# Patient Record
Sex: Male | Born: 1995 | Hispanic: No | Marital: Single | Smoking: Current every day smoker
Health system: Southern US, Community
[De-identification: ages and names within clinical notes are randomized; demographics above are authoritative.]

## PROBLEM LIST (undated history)

## (undated) HISTORY — PX: HERNIA REPAIR: SHX51

---

## 2018-03-20 ENCOUNTER — Encounter (HOSPITAL_BASED_OUTPATIENT_CLINIC_OR_DEPARTMENT_OTHER): Payer: Self-pay | Admitting: *Deleted

## 2018-03-20 ENCOUNTER — Other Ambulatory Visit: Payer: Self-pay

## 2018-03-20 ENCOUNTER — Emergency Department (HOSPITAL_BASED_OUTPATIENT_CLINIC_OR_DEPARTMENT_OTHER): Payer: 59

## 2018-03-20 ENCOUNTER — Emergency Department (HOSPITAL_BASED_OUTPATIENT_CLINIC_OR_DEPARTMENT_OTHER)
Admission: EM | Admit: 2018-03-20 | Discharge: 2018-03-20 | Disposition: A | Discharge status: Home / Self Care | Payer: 59 | Attending: Emergency Medicine | Admitting: Emergency Medicine

## 2018-03-20 DIAGNOSIS — N50811 Right testicular pain: Secondary | ICD-10-CM | POA: Diagnosis present

## 2018-03-20 DIAGNOSIS — N50819 Testicular pain, unspecified: Secondary | ICD-10-CM

## 2018-03-20 LAB — URINALYSIS, ROUTINE W REFLEX MICROSCOPIC
BILIRUBIN URINE: NEGATIVE
Glucose, UA: NEGATIVE mg/dL
Ketones, ur: NEGATIVE mg/dL
Leukocytes, UA: NEGATIVE
Nitrite: NEGATIVE
Protein, ur: NEGATIVE mg/dL
SPECIFIC GRAVITY, URINE: 1.01 (ref 1.005–1.030)
pH: 5.5 (ref 5.0–8.0)

## 2018-03-20 LAB — URINALYSIS, MICROSCOPIC (REFLEX)

## 2018-03-20 NOTE — ED Triage Notes (Signed)
Pt reports right testicle sore x 3 days. Denies injury, denies penile discharge. States in a relationship, no new sexual contacts

## 2018-03-20 NOTE — ED Provider Notes (Signed)
[facility] EMERGENCY DEPARTMENT Provider Note   CSN: [id number] Arrival date & time: 03/20/18  1513     History   Chief Complaint Chief Complaint  Patient presents with  . Testicle Pain    HPI Aaron Powell is a 22 y.o. male.  Pt presents to the ED today with right testicular pain.  Pt said it has been going on for 3 days.  Pt denies any known STD exposure.  He denies penile d/c.  No other sx.     History reviewed. No pertinent past medical history.  There are no active problems to display for this patient.   Past Surgical History:  Procedure Laterality Date  . HERNIA REPAIR          Home Medications    Prior to Admission medications   Not on File    Family History No family history on file.  Social History Social History   Tobacco Use  . Smoking status: Never Smoker  . Smokeless tobacco: Never Used  Substance Use Topics  . Alcohol use: Yes  . Drug use: Never     Allergies   Patient has no known allergies.   Review of Systems Review of Systems  Genitourinary: Positive for testicular pain.  All other systems reviewed and are negative.    Physical Exam Updated Vital Signs BP 127/69 (BP Location: Left Arm)   Pulse (!) 50   Temp 97.7 F (36.5 C) (Oral)   Resp 18   Ht 5\' 8"  (1.727 m)   Wt 74.8 kg   SpO2 99%   BMI 25.09 kg/m   Physical Exam Vitals signs and nursing note reviewed.  Constitutional:      Appearance: Normal appearance. He is normal weight.  HENT:     Head: Normocephalic.     Comments: Healing bruise under left eye.  No pain.    Nose: Nose normal.     Mouth/Throat:     Pharynx: Oropharynx is clear.  Eyes:     Pupils: Pupils are equal, round, and reactive to light.  Neck:     Musculoskeletal: Normal range of motion and neck supple.  Cardiovascular:     Rate and Rhythm: Normal rate and regular rhythm.  Pulmonary:     Effort: Pulmonary effort is normal.     Breath sounds: Normal breath sounds.    Abdominal:     General: Abdomen is flat.  Genitourinary:    Penis: Circumcised.      Scrotum/Testes:        Right: Tenderness present. Swelling not present.        Left: Swelling not present.     Epididymis:     Right: Tenderness present.  Musculoskeletal: Normal range of motion.  Skin:    General: Skin is warm.     Capillary Refill: Capillary refill takes less than 2 seconds.  Neurological:     General: No focal deficit present.     Mental Status: He is alert.  Psychiatric:        Mood and Affect: Mood normal.      ED Treatments / Results  Labs (all labs ordered are listed, but only abnormal results are displayed) Labs Reviewed  URINALYSIS, ROUTINE W REFLEX MICROSCOPIC - Abnormal; Notable for the following components:      Result Value   Hgb urine dipstick TRACE (*)    All other components within normal limits  URINALYSIS, MICROSCOPIC (REFLEX) - Abnormal; Notable for the following components:   Bacteria, UA  FEW (*)    All other components within normal limits  GC/CHLAMYDIA PROBE AMP [facility]    EKG None  Radiology [place] Scrotum W/doppler  Result Date: 03/20/2018 CLINICAL DATA:  Enhance linear LEFT testicular pain for 3 days. EXAM: SCROTAL ULTRASOUND DOPPLER ULTRASOUND OF THE TESTICLES TECHNIQUE: Complete ultrasound examination of the testicles, epididymis, and other scrotal structures was performed. Color and spectral Doppler ultrasound were also utilized to evaluate blood flow to the testicles. COMPARISON:  None. FINDINGS: Right testicle Measurements: 4.8 x 2.2 x 2.7 cm.  Homogeneous echotexture.  No mass Left testicle Measurements: 4.7 x 2.7 x 3.2 cm. Homogeneous echotexture. No mass. A solitary small calcification is considered benign. Right epididymis:  Normal in size and appearance. Left epididymis:  Normal in size and appearance. Hydrocele:  None visualized. Varicocele:  None visualized. Pulsed Doppler interrogation of both testes demonstrates normal  low resistance arterial and venous waveforms bilaterally. IMPRESSION: Normal testicular ultrasound. Electronically Signed   By: Genevive BiStewart  Edmunds M.D.   On: 03/20/2018 17:50    Procedures Procedures (including critical care time)  Medications Ordered in ED Medications - No data to display   Initial Impression / Assessment and Plan / ED Course  I have reviewed the triage vital signs and the nursing notes.  Pertinent labs & imaging results that were available during my care of the patient were reviewed by me and considered in my medical decision making (see chart for details).     Testicular us nl.  UA nl.  Gc/Chl pending.  Pt is told to wear supportive briefs.  F/u with urology if sx worsen.  Return if worse.  Final Clinical Impressions(s) / ED Diagnoses   Final diagnoses:  Testicular pain    ED Discharge Orders    None       Jacalyn LefevreHaviland, Christana Angelica, MD 03/20/18 1810

## 2018-03-20 NOTE — Discharge Instructions (Signed)
Tylenol and ibuprofen for pain

## 2018-03-22 LAB — GC/CHLAMYDIA PROBE AMP (~~LOC~~) NOT AT ARMC
Chlamydia: NEGATIVE
Neisseria Gonorrhea: NEGATIVE

## 2019-03-14 IMAGING — US US SCROTUM W/ DOPPLER COMPLETE
1 series · 14 of 25 positions shown · non-contrast
Comparison: None.

CLINICAL DATA: Enhance linear LEFT testicular pain for 3 days.

EXAM:
SCROTAL ULTRASOUND
DOPPLER ULTRASOUND OF THE TESTICLES
TECHNIQUE: Complete ultrasound examination of the testicles, epididymis, and
other scrotal structures was performed. Color and spectral Doppler
ultrasound were also utilized to evaluate blood flow to the
testicles.

[Series 1: us scrotum w/ doppler complete · 0.07mm/px · 14 of 37 slices shown]
[im 1/37]
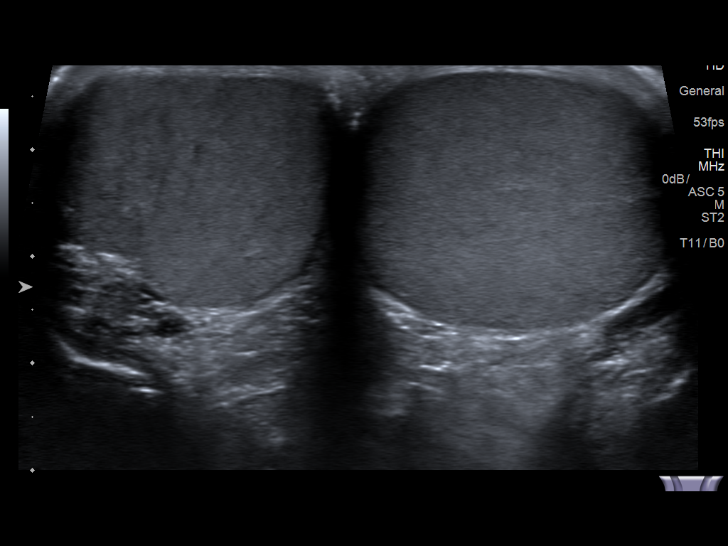
[im 4/37]
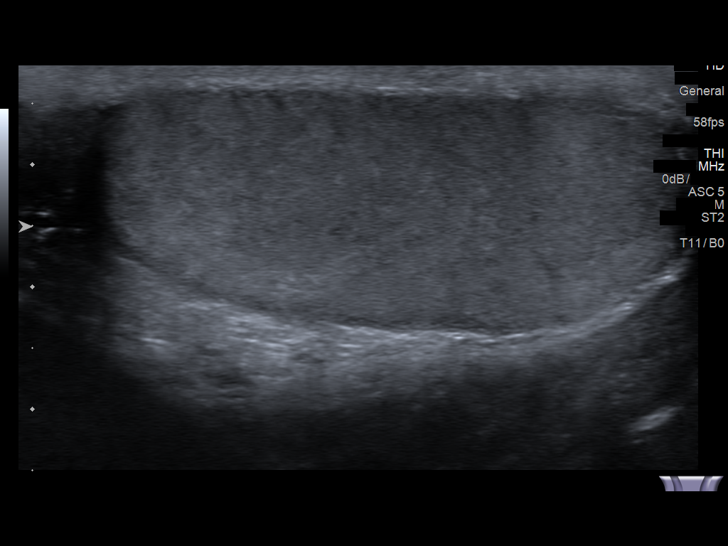
[im 7/37]
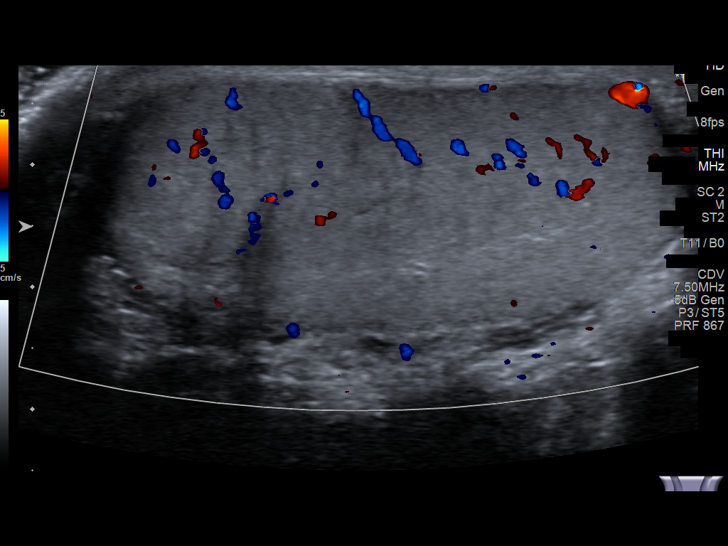
[im 10/37]
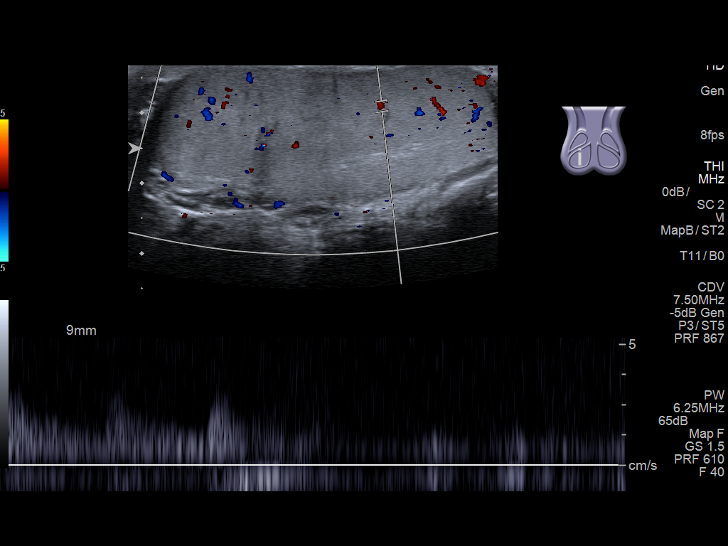
[im 13/37]
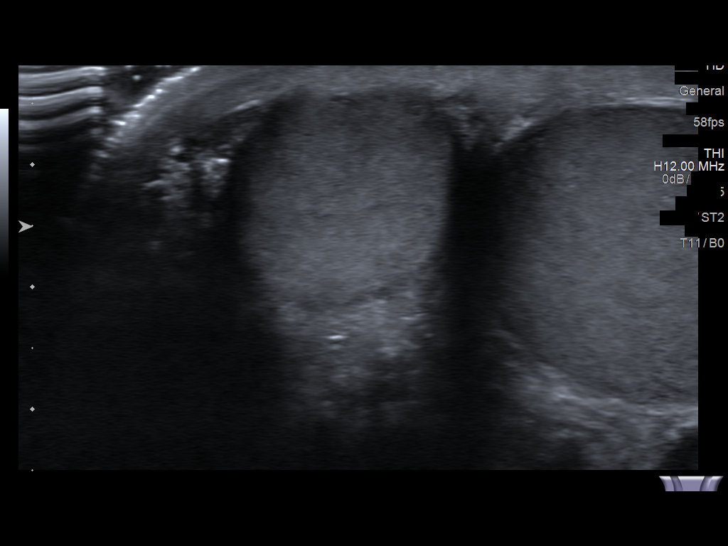
[im 14/37]
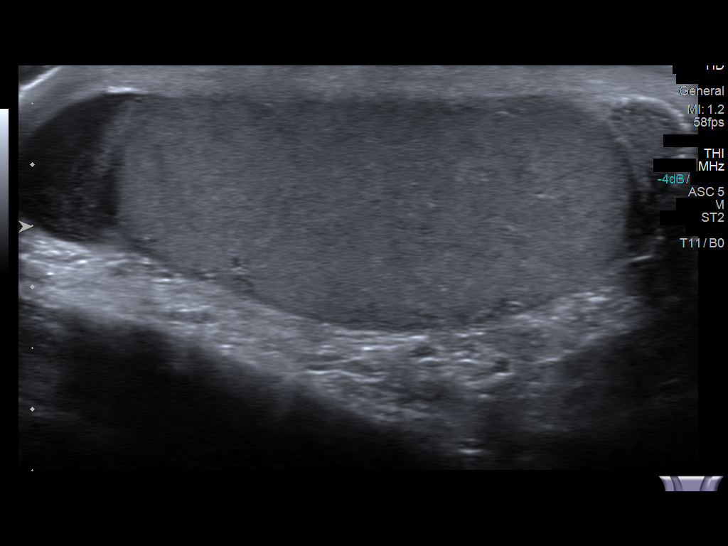
[im 17/37]
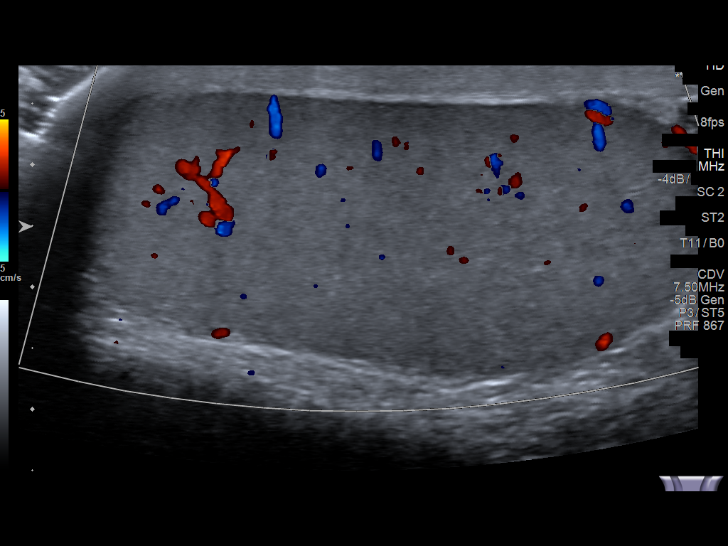
[im 20/37]
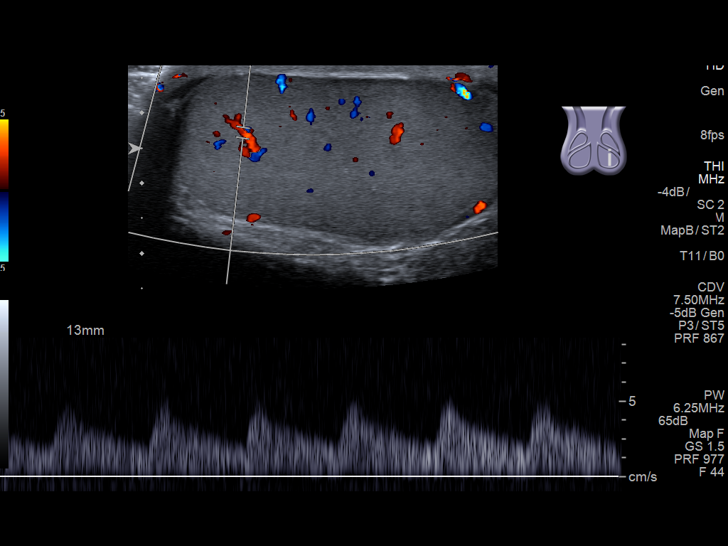
[im 23/37]
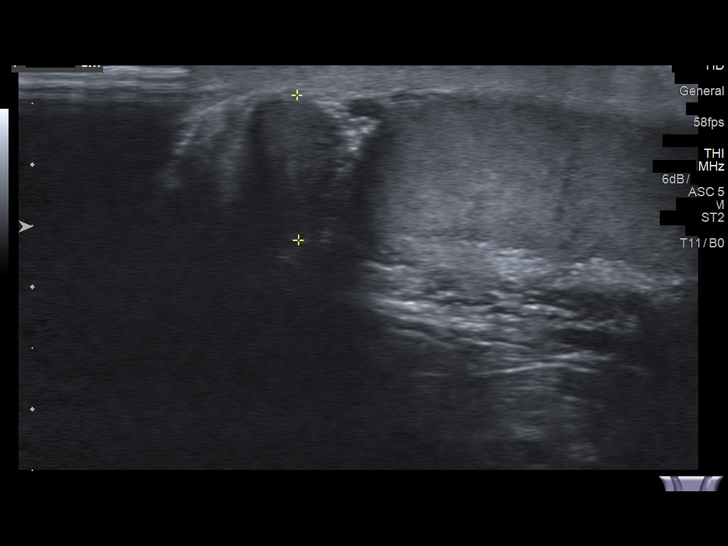
[im 25/37]
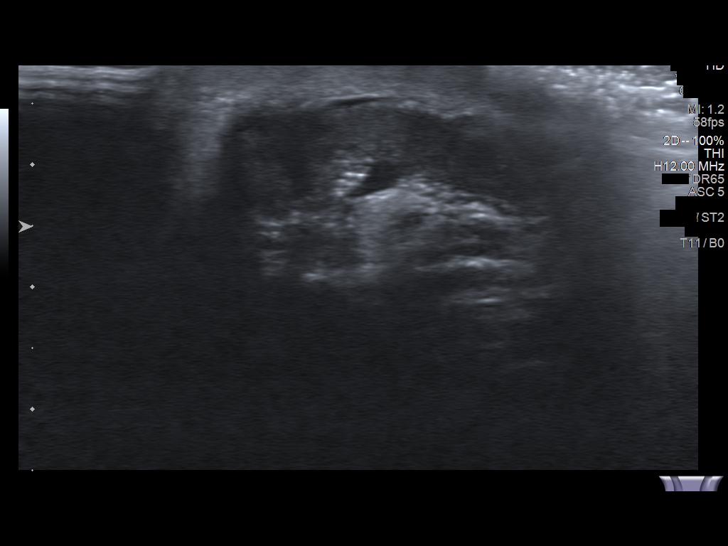
[im 28/37]
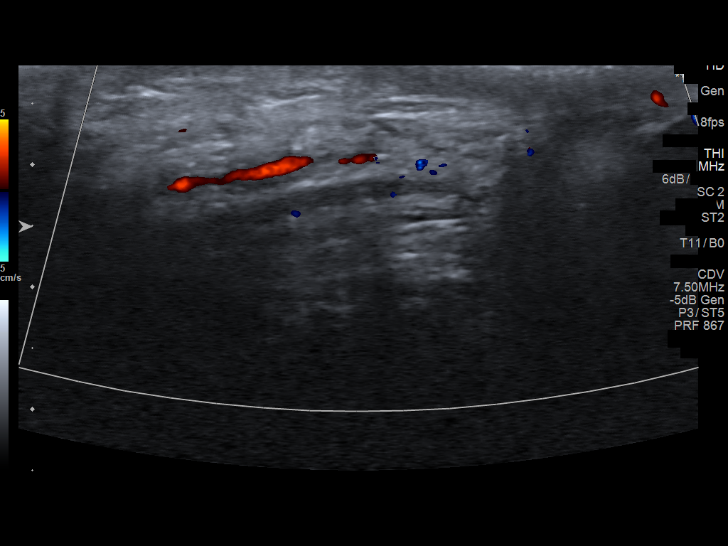
[im 31/37]
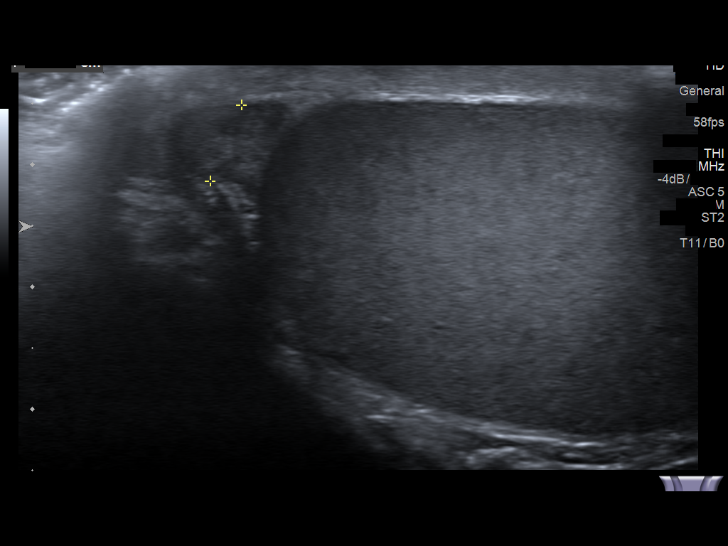
[im 34/37]
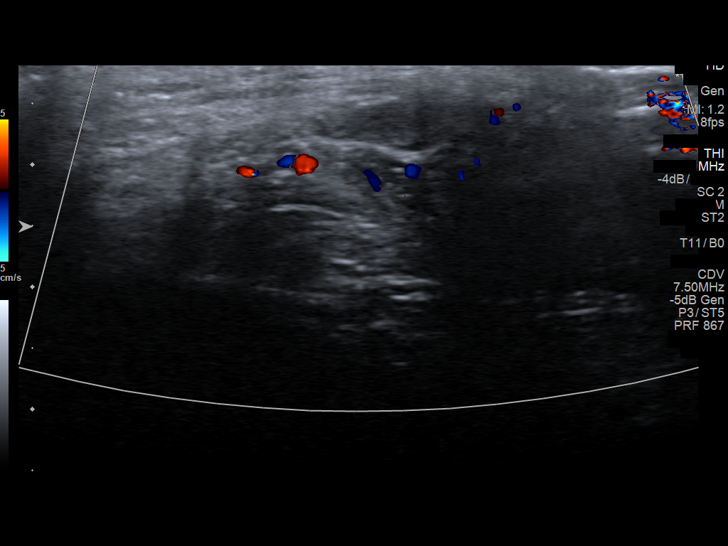
[im 37/37]
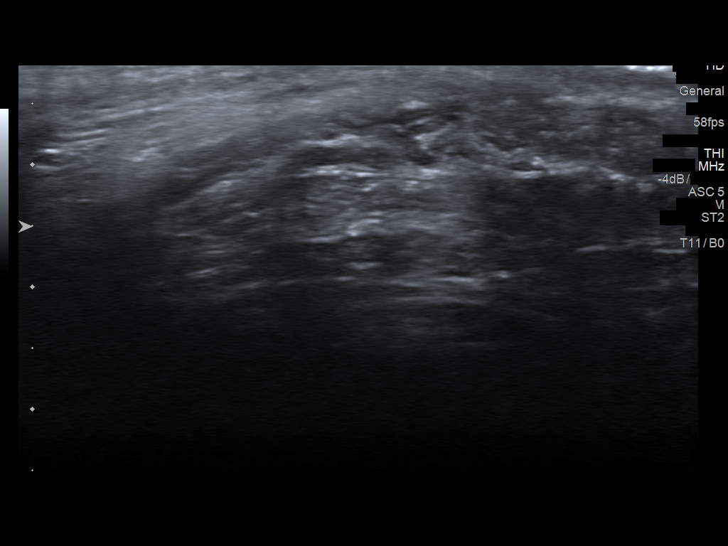

[14 of 25 positions shown; findings below may reference images not displayed]

FINDINGS: Right testicle

Measurements: 4.8 x 2.2 x 2.7 cm.  Homogeneous echotexture.  No mass

Left testicle

Measurements: 4.7 x 2.7 x 3.2 cm. Homogeneous echotexture. No mass.
A solitary small calcification is considered benign.

Right epididymis:  Normal in size and appearance.

Left epididymis:  Normal in size and appearance.

Hydrocele:  None visualized.

Varicocele:  None visualized.

Pulsed Doppler interrogation of both testes demonstrates normal low
resistance arterial and venous waveforms bilaterally.
IMPRESSION: Normal testicular ultrasound.

## 2019-03-17 ENCOUNTER — Ambulatory Visit: Payer: 59 | Attending: Internal Medicine

## 2019-03-17 DIAGNOSIS — Z20822 Contact with and (suspected) exposure to covid-19: Secondary | ICD-10-CM

## 2019-03-18 LAB — NOVEL CORONAVIRUS, NAA: SARS-CoV-2, NAA: DETECTED — AB

## 2019-05-26 ENCOUNTER — Emergency Department (HOSPITAL_BASED_OUTPATIENT_CLINIC_OR_DEPARTMENT_OTHER)
Admission: EM | Admit: 2019-05-26 | Discharge: 2019-05-26 | Disposition: A | Discharge status: Home / Self Care | Payer: 59 | Attending: Emergency Medicine | Admitting: Emergency Medicine

## 2019-05-26 ENCOUNTER — Emergency Department (HOSPITAL_BASED_OUTPATIENT_CLINIC_OR_DEPARTMENT_OTHER): Payer: 59

## 2019-05-26 ENCOUNTER — Encounter (HOSPITAL_BASED_OUTPATIENT_CLINIC_OR_DEPARTMENT_OTHER): Payer: Self-pay | Admitting: Emergency Medicine

## 2019-05-26 ENCOUNTER — Other Ambulatory Visit: Payer: Self-pay

## 2019-05-26 DIAGNOSIS — F17290 Nicotine dependence, other tobacco product, uncomplicated: Secondary | ICD-10-CM | POA: Insufficient documentation

## 2019-05-26 DIAGNOSIS — R0789 Other chest pain: Secondary | ICD-10-CM | POA: Diagnosis present

## 2019-05-26 DIAGNOSIS — K2920 Alcoholic gastritis without bleeding: Secondary | ICD-10-CM | POA: Diagnosis not present

## 2019-05-26 LAB — CBC WITH DIFFERENTIAL/PLATELET
Abs Immature Granulocytes: 0.01 10*3/uL (ref 0.00–0.07)
Basophils Absolute: 0 10*3/uL (ref 0.0–0.1)
Basophils Relative: 1 %
Eosinophils Absolute: 0 10*3/uL (ref 0.0–0.5)
Eosinophils Relative: 1 %
HCT: 45.6 % (ref 39.0–52.0)
Hemoglobin: 15.4 g/dL (ref 13.0–17.0)
Immature Granulocytes: 0 %
Lymphocytes Relative: 49 %
Lymphs Abs: 3.3 10*3/uL (ref 0.7–4.0)
MCH: 30.8 pg (ref 26.0–34.0)
MCHC: 33.8 g/dL (ref 30.0–36.0)
MCV: 91.2 fL (ref 80.0–100.0)
Monocytes Absolute: 0.4 10*3/uL (ref 0.1–1.0)
Monocytes Relative: 6 %
Neutro Abs: 2.8 10*3/uL (ref 1.7–7.7)
Neutrophils Relative %: 43 %
Platelets: 271 10*3/uL (ref 150–400)
RBC: 5 MIL/uL (ref 4.22–5.81)
RDW: 12.5 % (ref 11.5–15.5)
WBC: 6.6 10*3/uL (ref 4.0–10.5)
nRBC: 0 % (ref 0.0–0.2)

## 2019-05-26 LAB — BASIC METABOLIC PANEL
Anion gap: 9 (ref 5–15)
BUN: 16 mg/dL (ref 6–20)
CO2: 27 mmol/L (ref 22–32)
Calcium: 10.2 mg/dL (ref 8.9–10.3)
Chloride: 102 mmol/L (ref 98–111)
Creatinine, Ser: 0.89 mg/dL (ref 0.61–1.24)
GFR calc Af Amer: >60 mL/min (ref >60)
GFR calc non Af Amer: >60 mL/min (ref >60)
Glucose, Bld: 76 mg/dL (ref 70–99)
Potassium: 3.9 mmol/L (ref 3.5–5.1)
Sodium: 138 mmol/L (ref 135–145)

## 2019-05-26 LAB — TROPONIN I (HIGH SENSITIVITY): Troponin I (High Sensitivity): <2 ng/L (ref <18)

## 2019-05-26 LAB — HEPATIC FUNCTION PANEL
ALT: 38 U/L (ref 0–44)
AST: 37 U/L (ref 15–41)
Albumin: 5.3 g/dL — ABNORMAL HIGH (ref 3.5–5.0)
Alkaline Phosphatase: 68 U/L (ref 38–126)
Bilirubin, Direct: <0.1 mg/dL (ref 0.0–0.2)
Total Bilirubin: 0.5 mg/dL (ref 0.3–1.2)
Total Protein: 8.6 g/dL — ABNORMAL HIGH (ref 6.5–8.1)

## 2019-05-26 LAB — LIPASE, BLOOD: Lipase: 36 U/L (ref 11–51)

## 2019-05-26 MED ORDER — LIDOCAINE VISCOUS HCL 2 % MT SOLN
15.0000 mL | Freq: Once | OROMUCOSAL | Status: AC
  Administered 2019-05-26: 20:00:00 15 mL via ORAL
  Filled 2019-05-26: qty 15

## 2019-05-26 MED ORDER — PANTOPRAZOLE SODIUM 20 MG PO TBEC: 20.0000 mg | DELAYED_RELEASE_TABLET | Freq: Every day | ORAL | 0 refills | Status: AC

## 2019-05-26 MED ORDER — ALUM & MAG HYDROXIDE-SIMETH 200-200-20 MG/5ML PO SUSP
30.0000 mL | Freq: Once | ORAL | Status: AC
  Administered 2019-05-26: 20:00:00 30 mL via ORAL
  Filled 2019-05-26: qty 30

## 2019-05-26 MED ORDER — SODIUM CHLORIDE 0.9 % IV BOLUS
500.0000 mL | Freq: Once | INTRAVENOUS | Status: AC
  Administered 2019-05-26: 500 mL via INTRAVENOUS

## 2019-05-26 MED ORDER — PANTOPRAZOLE SODIUM 40 MG IV SOLR
40.0000 mg | Freq: Once | INTRAVENOUS | Status: AC
  Administered 2019-05-26: 20:00:00 40 mg via INTRAVENOUS
  Filled 2019-05-26: qty 40

## 2019-05-26 MED ORDER — FAMOTIDINE 20 MG PO TABS: 20.0000 mg | ORAL_TABLET | Freq: Two times a day (BID) | ORAL | 0 refills | Status: DC

## 2019-05-26 NOTE — ED Triage Notes (Signed)
Pain under L ribs x 3 days with SOB

## 2019-05-26 NOTE — Discharge Instructions (Signed)
You have been diagnosed today with atypical chest pain, stomach inflammation.  At this time there does not appear to be the presence of an emergent medical condition, however there is always the potential for conditions to change. Please read and follow the below instructions.  Please return to the Emergency Department immediately for any new or worsening symptoms. Please be sure to follow up with your Primary Care Provider within one week regarding your visit today; please call their office to schedule an appointment even if you are feeling better for a follow-up visit. Please take the medications Pepcid and Protonix as prescribed to help with your symptoms.  Please avoid alcohol use as this will likely worsen your symptoms.  Please drink plenty water and get plenty of rest.  Please call the gastroenterologist Dr. Loreta Ave on your discharge paperwork tomorrow morning to schedule a follow-up appointment for further evaluation.  Get help right away if: You throw up blood or something that looks like coffee grounds. You have black or dark red poop. You throw up any time you try to drink fluids. Your stomach pain gets worse. You have a fever. You do not feel better after one week. Your chest pain is worse. You have a cough that gets worse, or you cough up blood. You have very bad (severe) pain in your belly (abdomen). You pass out (faint). You have either of these for no clear reason: Sudden chest discomfort. Sudden discomfort in your arms, back, neck, or jaw. You have shortness of breath at any time. You suddenly start to sweat, or your skin gets clammy. You feel sick to your stomach (nauseous). You throw up (vomit). You suddenly feel lightheaded or dizzy. You feel very weak or tired. Your heart starts to beat fast, or it feels like it is skipping beats. You have any new/concerning or worsening of symptoms   Please read the additional information packets attached to your discharge  summary.  Do not take your medicine if  develop an itchy rash, swelling in your mouth or lips, or difficulty breathing; call 911 and seek immediate emergency medical attention if this occurs.  Note: Portions of this text may have been transcribed using voice recognition software. Every effort was made to ensure accuracy; however, inadvertent computerized transcription errors may still be present.

## 2019-05-26 NOTE — ED Provider Notes (Signed)
[facility] EMERGENCY DEPARTMENT Provider Note   CSN: [id number] Arrival date & time: 05/26/19  1831     History Chief Complaint  Patient presents with  . Chest Pain    Aaron Powell is a 23 y.o. male otherwise healthy no daily medication use.  Patient presents today for left upper quadrant pain that began Monday morning.  Patient reports that he was out with his friends on Sunday night and had drank some alcohol, he reports that he does not often drink alcohol and this was probably the first time in 6 months that he had drank.  When he woke up on Monday morning he had a left upper quadrant abdominal pain a burning sensation nonradiating worsened with eating and without alleviating factors, no medications prior to arrival for his symptoms, pain is currently mild-moderate in intensity.  He reports that when pain is at its worst he feels like it causes him to be slightly short of breath but he denies any shortness of breath today.  Additionally patient reports one episode of dark stool yesterday, he denies that stool is black but reports darker than normal.  Denies fever/chills, headache, neck pain, cough/hemoptysis, nausea/vomiting, diarrhea, dysuria/hematuria, fall/injury, extremity swelling/color change, exogenous hormone use, immobilization, surgeries, history of cancer, history of blood clot or any additional concerns.  HPI     History reviewed. No pertinent past medical history.  There are no problems to display for this patient.   Past Surgical History:  Procedure Laterality Date  . HERNIA REPAIR         No family history on file.  Social History   Tobacco Use  . Smoking status: Current Every Day Smoker    Types: E-cigarettes  . Smokeless tobacco: Never Used  Substance Use Topics  . Alcohol use: Yes  . Drug use: Never    Home Medications Prior to Admission medications   Medication Sig Start Date End Date Taking? Authorizing Provider  famotidine  (PEPCID) 20 MG tablet Take 1 tablet (20 mg total) by mouth 2 (two) times daily. 05/26/19   Harlene Salts A, PA-C  pantoprazole (PROTONIX) 20 MG tablet Take 1 tablet (20 mg total) by mouth daily. 05/26/19   Bill Salinas, PA-C    Allergies    Patient has no known allergies.  Review of Systems   Review of Systems Ten systems are reviewed and are negative for acute change except as noted in the HPI  Physical Exam Updated Vital Signs BP (!) 110/39 (BP Location: Right Arm)   Pulse (!) 54   Temp 98.3 F (36.8 C) (Oral)   Resp 18   Ht 5\' 8"  (1.727 m)   Wt 72.6 kg   SpO2 99%   BMI 24.33 kg/m   Physical Exam Constitutional:      General: He is not in acute distress.    Appearance: Normal appearance. He is well-developed. He is not ill-appearing or diaphoretic.  HENT:     Head: Normocephalic and atraumatic.     Right Ear: External ear normal.     Left Ear: External ear normal.     Nose: Nose normal.  Eyes:     General: Vision grossly intact. Gaze aligned appropriately.     Pupils: Pupils are equal, round, and reactive to light.  Neck:     Trachea: Trachea and phonation normal. No tracheal deviation.  Cardiovascular:     Rate and Rhythm: Normal rate and regular rhythm.     Pulses:  Dorsalis pedis pulses are 2+ on the right side and 2+ on the left side.     Heart sounds: Normal heart sounds.  Pulmonary:     Effort: Pulmonary effort is normal. No respiratory distress.     Breath sounds: Normal breath sounds.  Chest:     Chest wall: No deformity, tenderness or crepitus.  Abdominal:     General: There is no distension.     Palpations: Abdomen is soft.     Tenderness: There is abdominal tenderness in the left upper quadrant. There is no guarding or rebound. Negative signs include Murphy's sign.  Musculoskeletal:        General: Normal range of motion.     Cervical back: Normal range of motion.     Right lower leg: No tenderness. No edema.     Left lower leg: No  tenderness. No edema.  Skin:    General: Skin is warm and dry.  Neurological:     Mental Status: He is alert.     GCS: GCS eye subscore is 4. GCS verbal subscore is 5. GCS motor subscore is 6.     Comments: Speech is clear and goal oriented, follows commands Major Cranial nerves without deficit, no facial droop Moves extremities without ataxia, coordination intact  Psychiatric:        Behavior: Behavior normal.     ED Results / Procedures / Treatments   Labs (all labs ordered are listed, but only abnormal results are displayed) Labs Reviewed  HEPATIC FUNCTION PANEL - Abnormal; Notable for the following components:      Result Value   Total Protein 8.6 (*)    Albumin 5.3 (*)    All other components within normal limits  BASIC METABOLIC PANEL  LIPASE, BLOOD  CBC WITH DIFFERENTIAL/PLATELET  TROPONIN I (HIGH SENSITIVITY)    EKG EKG Interpretation  Date/Time:  Thursday May 26 2019 18:44:59 EST Ventricular Rate:  51 PR Interval:    QRS Duration: 90 QT Interval:  420 QTC Calculation: 387 R Axis:   97 Text Interpretation: Sinus rhythm Borderline right axis deviation no acute ST/T changes No old tracing to compare Confirmed by Pricilla Loveless [id number]) on 05/26/2019 7:07:31 PM   Radiology DG Chest Port 1 View  Result Date: 05/26/2019 CLINICAL DATA:  Left rib pain for 3 days, short of breath EXAM: PORTABLE CHEST 1 VIEW COMPARISON:  None. FINDINGS: The heart size and mediastinal contours are within normal limits. Both lungs are clear. The visualized skeletal structures are unremarkable. IMPRESSION: No active disease. Electronically Signed   By: Sharlet Salina M.D.   On: 05/26/2019 20:07    Procedures Procedures (including critical care time)  Medications Ordered in ED Medications  alum & mag hydroxide-simeth (MAALOX/MYLANTA) 200-200-20 MG/5ML suspension 30 mL (30 mLs Oral Given 05/26/19 1955)    And  lidocaine (XYLOCAINE) 2 % viscous mouth solution 15 mL (15 mLs Oral  Given 05/26/19 1955)  pantoprazole (PROTONIX) injection 40 mg (40 mg Intravenous Given 05/26/19 1951)  sodium chloride 0.9 % bolus 500 mL (500 mLs Intravenous New Bag/Given 05/26/19 1949)    ED Course  I have reviewed the triage vital signs and the nursing notes.  Pertinent labs & imaging results that were available during my care of the patient were reviewed by me and considered in my medical decision making (see chart for details).    MDM Rules/Calculators/A&P  24 year old male presents today with left upper quadrant pain/left lower chest pain x3 days, began after he woke up Monday morning, he had been drinking alcohol Sunday night with his friends, he reports he does not drink very often.  He has not tried any medication for symptoms.  No recent infectious-like symptoms or injuries.  On initial evaluation he is well-appearing in no acute distress vital signs stable on room air.  Heart regular rate and rhythm without murmur, lungs clear to auscultation, abdomen soft with minimal left upper quadrant tenderness, no guarding or peritoneal signs, negative Murphy sign.  He is neurovascular tact to all 4 extremities without evidence of DVT.  He is low risk by Wells criteria and PERC negative.  High suspicion for gastritis based on history and physical examination today.  Chest pain work-up has been started, will add lipase and LFTs today.  Will give GI cocktail and Protonix.  Additionally patient reported dark stool earlier this week.  He refuses rectal examination for Hemoccult test.  He denies any black stool or hematochezia. - High-sensitivity troponin negative, in the setting of ongoing pain for 3 days no delta troponin is indicated Lipase within normal limits no evidence of pancreatitis BMP within normal limits no emergent electrolyte derangement or evidence of kidney injury CBC within normal limits no sign of anemia or leukocytosis to suggest infection LFTs with mildly elevated  protein and albumin, LFTs within normal limits Chest x-ray:  IMPRESSION:  No active disease.   I personally reviewed patient's chest x-ray and agree with radiologist interpretation.  EKG: Sinus rhythm Borderline right axis deviation no acute ST/T changes No old tracing to compare Confirmed by Sherwood Gambler [id number]) on 05/26/2019 7:07:31 PM - Based on history, examination and work-up today there is no evidence for ACS as cause of his symptoms today, no indication for delta troponin, low risk by Wells criteria and PERC negative doubt pulmonary embolism as etiology of his symptoms.  Additionally history and work-up is inconsistent with a dissection or other acute cardiopulmonary etiology of his symptoms today.  Abdominal examination is benign and abdominal labs are reassuring today.  No evidence for a perforation, SBO, cholecystitis, appendicitis or other emergent intra-abdominal pathologies requiring imaging or further work-up at this time.  Suspect patient is experiencing gastritis due to alcohol intake on Sunday night.  Plan to treat with Pepcid/Protonix and refer to GI.  Discussed case and plan with Dr. Regenia Skeeter who agrees. - 9:05 PM: Patient reevaluated resting comfortably no acute distress vital signs stable.  He reports improvement of his symptoms following GI cocktail and Protonix today.  He refuses rectal examination for evaluation of occult blood in the stool and is requesting discharge.  Feel this is reasonable at this time, will be treating patient with Pepcid/Protonix and refer to GI, no signs of anemia today and there is no indication for emergent GI consultation or any further work-up at this time.  Discussed with Dr. Regenia Skeeter who agrees.  At this time there does not appear to be any evidence of an acute emergency medical condition and the patient appears stable for discharge with appropriate outpatient follow up. Diagnosis was discussed with patient who verbalizes understanding of care plan  and is agreeable to discharge. I have discussed return precautions with patient who verbalizes understanding of return precautions. Patient encouraged to follow-up with their PCP and GI. All questions answered.   Note: Portions of this report may have been transcribed using voice recognition software. Every effort was made to ensure  accuracy; however, inadvertent computerized transcription errors may still be present. Final Clinical Impression(s) / ED Diagnoses Final diagnoses:  Atypical chest pain  Acute alcoholic gastritis, presence of bleeding unspecified    Rx / DC Orders ED Discharge Orders         Ordered    famotidine (PEPCID) 20 MG tablet  2 times daily     05/26/19 2122    pantoprazole (PROTONIX) 20 MG tablet  Daily     05/26/19 2122           Elizabeth Palau 05/26/19 2123    Pricilla Loveless, MD 05/27/19 [phone]

## 2020-11-16 IMAGING — DX DG CHEST 1V PORT
1 series · 1 of 1 positions shown · non-contrast
Comparison: None.

CLINICAL DATA: Left rib pain for 3 days, short of breath

EXAM:
PORTABLE CHEST 1 VIEW

[chest ap]
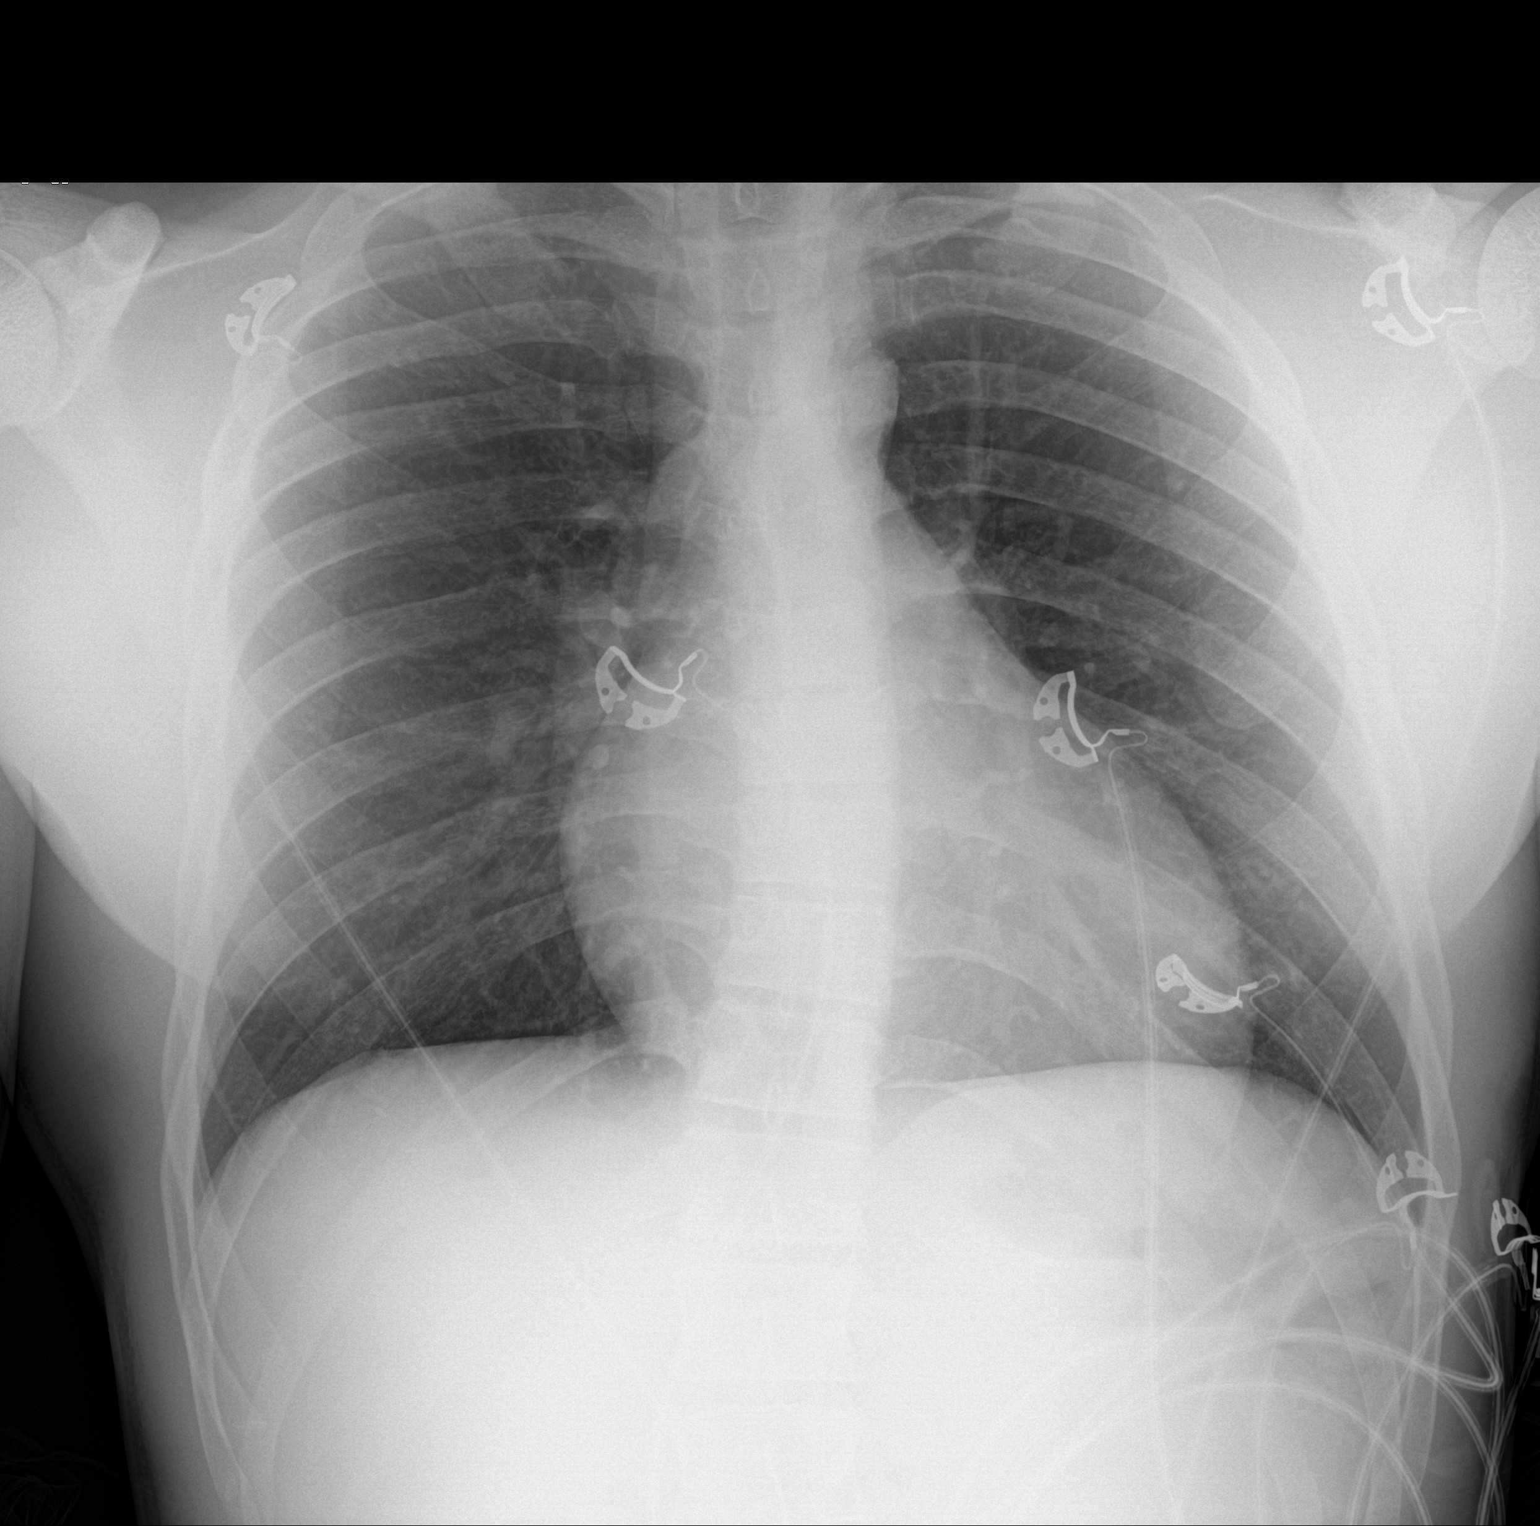

[1 of 1 positions shown; findings below may reference images not displayed]

FINDINGS: The heart size and mediastinal contours are within normal limits.
Both lungs are clear. The visualized skeletal structures are
unremarkable.
IMPRESSION: No active disease.

## 2022-03-28 ENCOUNTER — Other Ambulatory Visit: Payer: Self-pay

## 2022-03-28 ENCOUNTER — Emergency Department (HOSPITAL_BASED_OUTPATIENT_CLINIC_OR_DEPARTMENT_OTHER)
Admission: EM | Admit: 2022-03-28 | Discharge: 2022-03-28 | Disposition: A | Discharge status: Home / Self Care | Payer: Commercial Managed Care - HMO | Attending: Emergency Medicine | Admitting: Emergency Medicine

## 2022-03-28 ENCOUNTER — Encounter (HOSPITAL_BASED_OUTPATIENT_CLINIC_OR_DEPARTMENT_OTHER): Payer: Self-pay | Admitting: Urology

## 2022-03-28 DIAGNOSIS — R21 Rash and other nonspecific skin eruption: Secondary | ICD-10-CM | POA: Diagnosis present

## 2022-03-28 MED ORDER — PREDNISONE 10 MG PO TABS: 20.0000 mg | ORAL_TABLET | Freq: Every day | ORAL | 0 refills | Status: AC

## 2022-03-28 NOTE — ED Provider Notes (Signed)
[facility] EMERGENCY DEPARTMENT Provider Note   CSN: [id number] Arrival date & time: 03/28/22  1530     History  Chief Complaint  Patient presents with   Rash    Aaron Powell is a 26 y.o. male.  Patient here with hives to his bilateral axilla and bilateral inguinal folds.  Happens at times.  No new exposure.  Denies any new soap.  No respiratory symptoms.  No nausea vomiting diarrhea.  History of the same.  Usually self resolves.  Gets it frequently.  Has not a primary care doctor.  The history is provided by the patient.       Home Medications Prior to Admission medications   Medication Sig Start Date End Date Taking? Authorizing Provider  predniSONE (DELTASONE) 10 MG tablet Take 2 tablets (20 mg total) by mouth daily for 5 days. 03/28/22 04/02/22 Yes Jesica Goheen, DO  famotidine (PEPCID) 20 MG tablet Take 1 tablet (20 mg total) by mouth 2 (two) times daily. 05/26/19   Harlene Salts A, PA-C  pantoprazole (PROTONIX) 20 MG tablet Take 1 tablet (20 mg total) by mouth daily. 05/26/19   Bill Salinas, PA-C      Allergies    Patient has no known allergies.    Review of Systems   Review of Systems  Physical Exam Updated Vital Signs BP (!) 124/46 (BP Location: Right Arm)   Pulse 60   Temp 98.3 F (36.8 C) (Oral)   Resp 18   Ht 5\' 8"  (1.727 m)   Wt 73.5 kg   SpO2 100%   BMI 24.63 kg/m  Physical Exam Vitals and nursing note reviewed.  Constitutional:      General: He is not in acute distress.    Appearance: He is well-developed.  HENT:     Head: Normocephalic and atraumatic.  Eyes:     Conjunctiva/sclera: Conjunctivae normal.  Cardiovascular:     Rate and Rhythm: Normal rate and regular rhythm.     Heart sounds: No murmur heard. Pulmonary:     Effort: Pulmonary effort is normal. No respiratory distress.     Breath sounds: Normal breath sounds.  Abdominal:     Palpations: Abdomen is soft.     Tenderness: There is no abdominal tenderness.   Musculoskeletal:        General: No swelling.     Cervical back: Neck supple.  Skin:    General: Skin is warm and dry.     Capillary Refill: Capillary refill takes less than 2 seconds.     Comments: Hives to bilateral axilla and bilateral inguinal folds  Neurological:     Mental Status: He is alert.  Psychiatric:        Mood and Affect: Mood normal.     ED Results / Procedures / Treatments   Labs (all labs ordered are listed, but only abnormal results are displayed) Labs Reviewed - No data to display  EKG None  Radiology No results found.  Procedures Procedures    Medications Ordered in ED Medications - No data to display  ED Course/ Medical Decision Making/ A&P                           Medical Decision Making Risk Prescription drug management.   Aaron Powell is here with hives.  Normal vitals.  No signs of anaphylaxis.  Overall mild hives likely moisture irritation or idiopathic process.  Will prescribe prednisone.  Will have him  follow-up primary care doctor.  I am not concerned for any other emergent rash at this time.  This chart was dictated using voice recognition software.  Despite best efforts to proofread,  errors can occur which can change the documentation meaning.         Final Clinical Impression(s) / ED Diagnoses Final diagnoses:  Rash    Rx / DC Orders ED Discharge Orders          Ordered    predniSONE (DELTASONE) 10 MG tablet  Daily        03/28/22 1620              Lennice Sites, DO 03/28/22 1622

## 2022-03-28 NOTE — ED Triage Notes (Signed)
Pt states recurrent rash that started 2 days ago  Rash to hips and bilateral axilla  Pt states itching to rash and has been using cortizone cream and calamine with some relief  Itching worse at night and in the am

## 2022-03-28 NOTE — ED Notes (Signed)
Pt provided discharge instructions and prescription information. Pt was given the opportunity to ask questions and questions were answered.   

## 2023-06-12 ENCOUNTER — Emergency Department (HOSPITAL_BASED_OUTPATIENT_CLINIC_OR_DEPARTMENT_OTHER)
Admission: EM | Admit: 2023-06-12 | Discharge: 2023-06-12 | Disposition: A | Discharge status: Home / Self Care | Payer: Self-pay | Attending: Emergency Medicine | Admitting: Emergency Medicine

## 2023-06-12 ENCOUNTER — Emergency Department (HOSPITAL_BASED_OUTPATIENT_CLINIC_OR_DEPARTMENT_OTHER): Payer: Self-pay

## 2023-06-12 ENCOUNTER — Other Ambulatory Visit: Payer: Self-pay

## 2023-06-12 DIAGNOSIS — K219 Gastro-esophageal reflux disease without esophagitis: Secondary | ICD-10-CM | POA: Diagnosis not present

## 2023-06-12 DIAGNOSIS — M6200 Separation of muscle (nontraumatic), unspecified site: Secondary | ICD-10-CM | POA: Insufficient documentation

## 2023-06-12 DIAGNOSIS — R1010 Upper abdominal pain, unspecified: Secondary | ICD-10-CM | POA: Diagnosis present

## 2023-06-12 DIAGNOSIS — M6208 Separation of muscle (nontraumatic), other site: Secondary | ICD-10-CM

## 2023-06-12 LAB — CBC WITH DIFFERENTIAL/PLATELET
Abs Immature Granulocytes: 0.03 10*3/uL (ref 0.00–0.07)
Basophils Absolute: 0 10*3/uL (ref 0.0–0.1)
Basophils Relative: 0 %
Eosinophils Absolute: 0.1 10*3/uL (ref 0.0–0.5)
Eosinophils Relative: 1 %
HCT: 42.8 % (ref 39.0–52.0)
Hemoglobin: 14.8 g/dL (ref 13.0–17.0)
Immature Granulocytes: 0 %
Lymphocytes Relative: 32 %
Lymphs Abs: 2.6 10*3/uL (ref 0.7–4.0)
MCH: 30.6 pg (ref 26.0–34.0)
MCHC: 34.6 g/dL (ref 30.0–36.0)
MCV: 88.4 fL (ref 80.0–100.0)
Monocytes Absolute: 0.5 10*3/uL (ref 0.1–1.0)
Monocytes Relative: 6 %
Neutro Abs: 4.9 10*3/uL (ref 1.7–7.7)
Neutrophils Relative %: 61 %
Platelets: 289 10*3/uL (ref 150–400)
RBC: 4.84 MIL/uL (ref 4.22–5.81)
RDW: 12.7 % (ref 11.5–15.5)
WBC: 8.1 10*3/uL (ref 4.0–10.5)
nRBC: 0 % (ref 0.0–0.2)

## 2023-06-12 LAB — COMPREHENSIVE METABOLIC PANEL
ALT: 23 U/L (ref 0–44)
AST: 28 U/L (ref 15–41)
Albumin: 4.6 g/dL (ref 3.5–5.0)
Alkaline Phosphatase: 39 U/L (ref 38–126)
Anion gap: 5 (ref 5–15)
BUN: 12 mg/dL (ref 6–20)
CO2: 29 mmol/L (ref 22–32)
Calcium: 9.2 mg/dL (ref 8.9–10.3)
Chloride: 106 mmol/L (ref 98–111)
Creatinine, Ser: 0.85 mg/dL (ref 0.61–1.24)
GFR, Estimated: >60 mL/min (ref >60)
Glucose, Bld: 90 mg/dL (ref 70–99)
Potassium: 3.9 mmol/L (ref 3.5–5.1)
Sodium: 140 mmol/L (ref 135–145)
Total Bilirubin: 0.7 mg/dL (ref 0.0–1.2)
Total Protein: 7.6 g/dL (ref 6.5–8.1)

## 2023-06-12 LAB — LIPASE, BLOOD: Lipase: 36 U/L (ref 11–51)

## 2023-06-12 MED ORDER — FAMOTIDINE 20 MG PO TABS: 20.0000 mg | ORAL_TABLET | Freq: Every day | ORAL | 0 refills | Status: AC

## 2023-06-12 MED ORDER — ALUM & MAG HYDROXIDE-SIMETH 200-200-20 MG/5ML PO SUSP
30.0000 mL | Freq: Once | ORAL | Status: AC
  Administered 2023-06-12: 30 mL via ORAL
  Filled 2023-06-12: qty 30

## 2023-06-12 MED ORDER — IOHEXOL 300 MG/ML  SOLN
100.0000 mL | Freq: Once | INTRAMUSCULAR | Status: AC | PRN
Start: 2023-06-12 — End: 2023-06-12
  Administered 2023-06-12: 100 mL via INTRAVENOUS

## 2023-06-12 MED ORDER — FAMOTIDINE 20 MG PO TABS
20.0000 mg | ORAL_TABLET | Freq: Once | ORAL | Status: AC
  Administered 2023-06-12: 20 mg via ORAL
  Filled 2023-06-12: qty 1

## 2023-06-12 MED ORDER — FAMOTIDINE 20 MG PO TABS: 20.0000 mg | ORAL_TABLET | Freq: Every day | ORAL | 0 refills | Status: DC

## 2023-06-12 NOTE — ED Triage Notes (Signed)
 Pt states that approximately two years ago he was diagnosed with "potentially a small hernia" around his umbilicus. Pt states that he does heavy weightlifting and sometimes feels as though he has something protruding in that area and also as if he has L lung pain. Pt states this has been ongoing for over one year. Denies N/V/D. Pt states that in the last few days pain has been more consistent and he has had decreased PO intake.

## 2023-06-12 NOTE — Discharge Instructions (Addendum)
 You were seen in the emergency department for your abdominal pain.  Your workup showed no abnormalities with your heart and lungs and no signs of infection or hernia in your abdomen.  Your CT did show findings concerning of what is called diastases recti which is when your abdominal muscles can stretch apart and that may be the bulging that you feel in your abdomen.  You should follow-up with your primary doctor for this and they can schedule physical therapy to help with this resolving.  I also suspect that you have acid reflux and I am given you a prescription of Pepcid and you should take this daily for at least the next 2 weeks to see if this helps with your symptoms.  You should return to the emergency department if you have severe abdominal pain, fevers, repetitive vomiting or any other new or concerning symptoms.

## 2023-06-12 NOTE — ED Notes (Signed)
 Patient transported to CT

## 2023-06-12 NOTE — ED Provider Notes (Signed)
 [place] EMERGENCY DEPARTMENT AT [facility] [place] Provider Note   CSN: [id number] Arrival date & time: 06/12/23  0940     History  No chief complaint on file.   Aaron Powell is a 28 y.o. male.  Patient is a 28 year old male with no significant past medical history presenting to the emergency department with abdominal pain.  The patient states that he had some mid to upper abdominal pain about 2 years ago and was seen by his doctor who thought that he might have a small hernia.  He states that the pain would be intermittent and would come and go, often exacerbated by exercising and lifting.  He states that over the last 2-week the pain has become more constant.  He states that it feels like a discomfort in his upper abdomen and occasionally feels like he can feel a bulge.  He states that he is stopped lifting for the last 2 weeks.  He denies any nausea or vomiting but reports a decreased appetite.  He denies any fevers.  He reports some mild constipation.  He denies any dysuria or hematuria.  He states he will also occasionally feel some of the discomfort on the left side of his chest and under his ribs with some mild shortness of breath.  The history is provided by the patient.       Home Medications Prior to Admission medications   Medication Sig Start Date End Date Taking? Authorizing Provider  famotidine (PEPCID) 20 MG tablet Take 1 tablet (20 mg total) by mouth daily. 06/12/23   Elayne Snare K, DO  pantoprazole (PROTONIX) 20 MG tablet Take 1 tablet (20 mg total) by mouth daily. 05/26/19   Bill Salinas, PA-C      Allergies    Patient has no known allergies.    Review of Systems   Review of Systems  Physical Exam Updated Vital Signs BP 102/61 (BP Location: Right Arm)   Pulse 65   Temp 98.3 F (36.8 C) (Oral)   Resp 20   Ht 5\' 8"  (1.727 m)   Wt 79.4 kg   SpO2 100%   BMI 26.61 kg/m  Physical Exam Vitals and nursing note reviewed.  Constitutional:       General: He is not in acute distress.    Appearance: Normal appearance.  HENT:     Head: Normocephalic and atraumatic.     Nose: Nose normal.     Mouth/Throat:     Mouth: Mucous membranes are moist.     Pharynx: Oropharynx is clear.  Eyes:     Extraocular Movements: Extraocular movements intact.     Conjunctiva/sclera: Conjunctivae normal.  Cardiovascular:     Rate and Rhythm: Normal rate and regular rhythm.     Heart sounds: Normal heart sounds.  Pulmonary:     Effort: Pulmonary effort is normal.     Breath sounds: Normal breath sounds.  Abdominal:     General: Abdomen is flat.     Palpations: Abdomen is soft.     Tenderness: There is abdominal tenderness (mild epigastric discomfort to palpation). There is no guarding or rebound.     Hernia: No hernia (no palpable abdominal wall hernia) is present.  Musculoskeletal:        General: Normal range of motion.     Cervical back: Normal range of motion.     Right lower leg: No edema.     Left lower leg: No edema.  Skin:    General: Skin  is warm and dry.  Neurological:     General: No focal deficit present.     Mental Status: He is alert and oriented to person, place, and time.  Psychiatric:        Mood and Affect: Mood normal.        Behavior: Behavior normal.     ED Results / Procedures / Treatments   Labs (all labs ordered are listed, but only abnormal results are displayed) Labs Reviewed  COMPREHENSIVE METABOLIC PANEL  CBC WITH DIFFERENTIAL/PLATELET  LIPASE, BLOOD    EKG EKG Interpretation Date/Time:  Friday June 12 2023 10:36:04 EDT Ventricular Rate:  65 PR Interval:  114 QRS Duration:  89 QT Interval:  369 QTC Calculation: 384 R Axis:   112  Text Interpretation: Sinus rhythm Borderline short PR interval Nonspecific repol abnormality, inferior leads ST elevation, consider lateral injury Duplicate EKG Confirmed by Elayne Snare ([id number]) on 06/12/2023 11:20:49 AM  Radiology CT ABDOMEN PELVIS W  CONTRAST Result Date: 06/12/2023 CLINICAL DATA:  Abdominal pain and decreased p.o. intake. Patient was diagnosed with "potentially a small hernia" around his umbilicus years ago. EXAM: CT ABDOMEN AND PELVIS WITH CONTRAST TECHNIQUE: Multidetector CT imaging of the abdomen and pelvis was performed using the standard protocol following bolus administration of intravenous contrast. RADIATION DOSE REDUCTION: This exam was performed according to the departmental dose-optimization program which includes automated exposure control, adjustment of the mA and/or kV according to patient size and/or use of iterative reconstruction technique. CONTRAST:  OMNIPAQUE IOHEXOL 300 MG/ML  SOLN COMPARISON:  None Available. FINDINGS: Lower chest: The visualized bibasilar lungs are clear. Normal heart size no pericardial effusion. Hepatobiliary: No focal liver abnormality is seen. No gallstones, gallbladder wall thickening, or biliary dilatation. Pancreas: Unremarkable. No pancreatic ductal dilatation or surrounding inflammatory changes. Spleen: Normal in size without focal abnormality. Adrenals/Urinary Tract: Symmetric enhancement of bilateral kidneys no hydronephrosis. Incidentally visualized 9 mm simple cyst in the superior pole of the right kidney. Bilateral adrenal glands are unremarkable. Distended urinary bladder. No urinary bladder wall thickening. Stomach/Bowel: Stomach is within normal limits. Appendix is not clearly visualized, however there are no inflammatory changes in the right lower quadrant. No evidence of bowel wall thickening, distention, or inflammatory changes. Vascular/Lymphatic: No significant vascular findings are present. No enlarged abdominal or pelvic lymph nodes. Reproductive: Prostate is unremarkable. Other: No abdominal wall hernia. However, there is slight widening of the inter-recti distance, measuring up to 25 mm. No abdominopelvic ascites. Musculoskeletal: No acute or significant osseous findings.  IMPRESSION: 1. No acute abnormality in the abdomen and pelvis. 2. Specifically, no abdominal wall hernia is identified. However, slight widening of the distance between the rectus abdominus muscles is noted. Recommend correlation with physical exam for assessment of diastasis recti. Electronically Signed   By: Jacob Moores M.D.   On: 06/12/2023 14:22   DG Chest 2 View Result Date: 06/12/2023 CLINICAL DATA:  Shortness of breath EXAM: CHEST - 2 VIEW COMPARISON:  Chest radiograph 05/26/2019 FINDINGS: The heart size and mediastinal contours are within normal limits. Both lungs are clear. The visualized skeletal structures are unremarkable. IMPRESSION: No active cardiopulmonary disease. Electronically Signed   By: Annia Belt M.D.   On: 06/12/2023 13:33    Procedures Procedures    Medications Ordered in ED Medications  famotidine (PEPCID) tablet 20 mg (20 mg Oral Given 06/12/23 1038)  alum & mag hydroxide-simeth (MAALOX/MYLANTA) 200-200-20 MG/5ML suspension 30 mL (30 mLs Oral Given 06/12/23 1038)  iohexol (OMNIPAQUE) 300 MG/ML solution 100  mL (100 mLs Intravenous Contrast Given 06/12/23 1200)    ED Course/ Medical Decision Making/ A&P Clinical Course as of 06/12/23 1443  Fri Jun 12, 2023  1120 Labs within normal range, imaging pending at this time. [VK]  1433 CTAP without acute abnormality. Suspect GERD and recommended starting on antacids. Does have findings suggestive of diastasis recti and will be recommended outpatient follow up for PT. [VK]    Clinical Course User Index [VK] Rexford Maus, DO                                 Medical Decision Making This patient presents to the ED with chief complaint(s) of abdominal pain with no pertinent past medical history which further complicates the presenting complaint. The complaint involves an extensive differential diagnosis and also carries with it a high risk of complications and morbidity.    The differential diagnosis includes  gastritis, GERD, hiatal hernia, abdominal wall hernia, no evidence of strangulated or incarcerated hernia on physical exam, pancreatitis, hepatitis, pneumonia, pneumothorax, pulmonary edema, pleural effusion, atypical ACS  Additional history obtained: Additional history obtained from N/A Records reviewed N/A  ED Course and Reassessment: On patient's arrival he is hemodynamically stable in no acute distress.  Patient will have EKG, labs, chest x-ray and CT abdomen pelvis to evaluate for cause of his pain.  Will be given GI cocktail for symptomatic management and will be closely reassessed.  Independent labs interpretation:  The following labs were independently interpreted: within normal range  Independent visualization of imaging: - I independently visualized the following imaging with scope of interpretation limited to determining acute life threatening conditions related to emergency care: CXR, CTAP, which revealed no acute abnormalities, findings concerning for diastasis recti  Consultation: - Consulted or discussed management/test interpretation w/ external professional: N/A  Consideration for admission or further workup: Patient has no emergent conditions requiring admission or further work-up at this time and is stable for discharge home with primary care follow-up  Social Determinants of health: N/A    Amount and/or Complexity of Data Reviewed Labs: ordered. Radiology: ordered.  Risk OTC drugs. Prescription drug management.          Final Clinical Impression(s) / ED Diagnoses Final diagnoses:  Gastroesophageal reflux disease without esophagitis  Diastasis recti    Rx / DC Orders ED Discharge Orders          Ordered    famotidine (PEPCID) 20 MG tablet  Daily,   Status:  Discontinued        06/12/23 1441    famotidine (PEPCID) 20 MG tablet  Daily        06/12/23 1442              Rexford Maus, DO 06/12/23 1443

## 2023-09-24 ENCOUNTER — Emergency Department (HOSPITAL_BASED_OUTPATIENT_CLINIC_OR_DEPARTMENT_OTHER)
Admission: EM | Admit: 2023-09-24 | Discharge: 2023-09-24 | Disposition: A | Discharge status: Home / Self Care | Payer: Self-pay | Attending: Emergency Medicine | Admitting: Emergency Medicine

## 2023-09-24 ENCOUNTER — Other Ambulatory Visit: Payer: Self-pay

## 2023-09-24 DIAGNOSIS — R21 Rash and other nonspecific skin eruption: Secondary | ICD-10-CM | POA: Insufficient documentation

## 2023-09-24 DIAGNOSIS — R7309 Other abnormal glucose: Secondary | ICD-10-CM | POA: Insufficient documentation

## 2023-09-24 LAB — CBG MONITORING, ED: Glucose-Capillary: 90 mg/dL (ref 70–99)

## 2023-09-24 MED ORDER — NYSTATIN 100000 UNIT/GM EX CREA: TOPICAL_CREAM | CUTANEOUS | 0 refills | Status: AC

## 2023-09-24 MED ORDER — DOXYCYCLINE HYCLATE 100 MG PO CAPS: 100.0000 mg | ORAL_CAPSULE | Freq: Two times a day (BID) | ORAL | 0 refills | Status: AC

## 2023-09-24 MED ORDER — PREDNISONE 50 MG PO TABS
60.0000 mg | ORAL_TABLET | Freq: Once | ORAL | Status: AC
  Administered 2023-09-24: 60 mg via ORAL
  Filled 2023-09-24: qty 1

## 2023-09-24 MED ORDER — VALACYCLOVIR HCL 1 G PO TABS: 1000.0000 mg | ORAL_TABLET | Freq: Three times a day (TID) | ORAL | 0 refills | Status: AC

## 2023-09-24 MED ORDER — TRIAMCINOLONE ACETONIDE 0.1 % EX CREA
1.0000 | TOPICAL_CREAM | Freq: Two times a day (BID) | CUTANEOUS | 0 refills | Status: AC
Start: 2023-09-24 — End: ?

## 2023-09-24 MED ORDER — PREDNISONE 50 MG PO TABS: ORAL_TABLET | ORAL | 0 refills | Status: DC

## 2023-09-24 MED ORDER — IBUPROFEN 800 MG PO TABS
800.0000 mg | ORAL_TABLET | Freq: Once | ORAL | Status: AC
  Administered 2023-09-24: 800 mg via ORAL
  Filled 2023-09-24: qty 1

## 2023-09-24 MED ORDER — FLUCONAZOLE 150 MG PO TABS
150.0000 mg | ORAL_TABLET | Freq: Once | ORAL | Status: AC
  Administered 2023-09-24: 150 mg via ORAL
  Filled 2023-09-24: qty 1

## 2023-09-24 NOTE — ED Provider Notes (Signed)
 [place] [facility] [place] Provider Note   CSN: [id number] Arrival date & time: 09/24/23  9584     Patient presents with: Rash   Aaron Powell is a 28 y.o. male.   Recurrent rash to bilateral inner thighs for the past 4 days.  Progressively worsening with itching, burning and redness.  Some clear drainage and blistering.  Has had rash previously, told it could be heat rash or dermatitis or fungal infection.  Has not seen a dermatologist.  No fever.  No chest pain or shortness of breath.  Difficulty breathing or difficulty swallowing.  No tongue swelling or lip swelling.  No recent new exposures to cleaning products, clothing or laundry detergent. He has been trying cool compresses at home without relief.  The history is provided by the patient.  Rash Associated symptoms: no abdominal pain, no fever, no headaches, no joint pain, no myalgias, no nausea, no shortness of breath and not vomiting        Prior to Admission medications   Medication Sig Start Date End Date Taking? Authorizing Provider  famotidine  (PEPCID ) 20 MG tablet Take 1 tablet (20 mg total) by mouth daily. 06/12/23   Kingsley, Victoria K, DO  pantoprazole  (PROTONIX ) 20 MG tablet Take 1 tablet (20 mg total) by mouth daily. 05/26/19   Donah Penne LABOR, PA-C    Allergies: Patient has no known allergies.    Review of Systems  Constitutional:  Negative for activity change, appetite change and fever.  HENT:  Negative for congestion and rhinorrhea.   Respiratory:  Negative for cough, chest tightness and shortness of breath.   Cardiovascular:  Negative for chest pain.  Gastrointestinal:  Negative for abdominal pain, nausea and vomiting.  Genitourinary:  Negative for dysuria and hematuria.  Musculoskeletal:  Negative for arthralgias and myalgias.  Skin:  Positive for rash.  Neurological:  Negative for dizziness, weakness and headaches.   all other systems are negative except as noted in  the HPI and PMH.    Updated Vital Signs BP 114/70 (BP Location: Left Arm)   Pulse (!) 50   Temp 97.9 F (36.6 C) (Oral)   Resp 16   Ht 5' 8 (1.727 m)   Wt 73.5 kg   SpO2 100%   BMI 24.63 kg/m   Physical Exam Vitals and nursing note reviewed.  Constitutional:      General: He is not in acute distress.    Appearance: He is well-developed.  HENT:     Head: Normocephalic and atraumatic.     Mouth/Throat:     Pharynx: No oropharyngeal exudate.   Eyes:     Conjunctiva/sclera: Conjunctivae normal.     Pupils: Pupils are equal, round, and reactive to light.   Neck:     Comments: No meningismus. Cardiovascular:     Rate and Rhythm: Normal rate and regular rhythm.     Heart sounds: Normal heart sounds. No murmur heard. Pulmonary:     Effort: Pulmonary effort is normal. No respiratory distress.     Breath sounds: Normal breath sounds.  Abdominal:     Palpations: Abdomen is soft.     Tenderness: There is no abdominal tenderness. There is no guarding or rebound.  Genitourinary:    Comments: Scrotum is normal.  Bilateral inner thighs have areas of erythema with some clear blistering and surrounding erythema.  No fluctuance.  Musculoskeletal:        General: No tenderness. Normal range of motion.  Cervical back: Normal range of motion and neck supple.   Skin:    General: Skin is warm.   Neurological:     Mental Status: He is alert and oriented to person, place, and time.     Cranial Nerves: No cranial nerve deficit.     Motor: No abnormal muscle tone.     Coordination: Coordination normal.     Comments:  5/5 strength throughout. CN 2-12 intact.Equal grip strength.   Psychiatric:        Behavior: Behavior normal.     (all labs ordered are listed, but only abnormal results are displayed) Labs Reviewed  CBG MONITORING, ED    EKG: None  Radiology: No results found.   Procedures   Medications Ordered in the ED  fluconazole (DIFLUCAN) tablet 150 mg (has no  administration in time range)  ibuprofen (ADVIL) tablet 800 mg (has no administration in time range)  predniSONE  (DELTASONE ) tablet 60 mg (has no administration in time range)                                    Medical Decision Making Amount and/or Complexity of Data Reviewed Labs: ordered. Decision-making details documented in ED Course. Radiology: ordered and independent interpretation performed. Decision-making details documented in ED Course. ECG/medicine tests: ordered and independent interpretation performed. Decision-making details documented in ED Course.  Risk Prescription drug management.   Recurrent inguinal rash of uncertain etiology.  No evidence of anaphylaxis.  Blood sugar is 90.  He is not diabetic. Denies history of herpes and no sexual contact in 2 years.  Blistering suggests possible allergic or contact dermatitis  component and will treat with topical and p.o. steroids.  Will also give course of antibiotics for possible bacterial superinfection and fungal infection. Also consider hepatic and will initiate antivirals  No evidence of anaphylaxis.  He is to follow-up with dermatologist for likely biopsy.  Will treat with antifungals, antibiotics and steroids.  Return precautions discussed     Final diagnoses:  None    ED Discharge Orders     None          Malaia Buchta, Garnette, MD 09/24/23 [phone]

## 2023-09-24 NOTE — Discharge Instructions (Signed)
 Follow-up with a dermatologist for further evaluation.  Take the antibiotics and steroids as prescribed.  Return to the ED with difficulty breathing, difficulty swallowing, other concerns.

## 2023-09-24 NOTE — ED Triage Notes (Signed)
 Pt reports bumpy rash on inner thighs starting on Saturday, getting worse with heat. No discharge noted. Has happened previously occasionally over past 2 years. Denies any others symptoms. NAD noted in triage.

## 2023-10-21 ENCOUNTER — Other Ambulatory Visit: Payer: Self-pay

## 2023-10-21 ENCOUNTER — Encounter (HOSPITAL_BASED_OUTPATIENT_CLINIC_OR_DEPARTMENT_OTHER): Payer: Self-pay | Admitting: Emergency Medicine

## 2023-10-21 ENCOUNTER — Emergency Department (HOSPITAL_BASED_OUTPATIENT_CLINIC_OR_DEPARTMENT_OTHER)
Admission: EM | Admit: 2023-10-21 | Discharge: 2023-10-21 | Disposition: A | Discharge status: Home / Self Care | Payer: Self-pay | Attending: Emergency Medicine | Admitting: Emergency Medicine

## 2023-10-21 DIAGNOSIS — L259 Unspecified contact dermatitis, unspecified cause: Secondary | ICD-10-CM | POA: Insufficient documentation

## 2023-10-21 MED ORDER — HYDROXYZINE HCL 25 MG PO TABS: 25.0000 mg | ORAL_TABLET | Freq: Four times a day (QID) | ORAL | 0 refills | Status: AC | PRN

## 2023-10-21 MED ORDER — PREDNISONE 10 MG (21) PO TBPK: ORAL_TABLET | Freq: Every day | ORAL | 0 refills | Status: AC

## 2023-10-21 MED ORDER — DEXAMETHASONE SODIUM PHOSPHATE 10 MG/ML IJ SOLN
10.0000 mg | Freq: Once | INTRAMUSCULAR | Status: AC
  Administered 2023-10-21: 10 mg via INTRAMUSCULAR
  Filled 2023-10-21: qty 1

## 2023-10-21 NOTE — Discharge Instructions (Signed)
 Take prednisone  as prescribed starting tomorrow. Take hydroxyzine  as needed as prescribed for itching.  This medication can cause you to be drowsy, do not drive while taking this medication.  Recommend topical calamine lotion.  Bathe with warm but not hot water.

## 2023-10-21 NOTE — ED Triage Notes (Signed)
 Pt here with recurrent rash to bil sides of trunk and groin; was seen 1 month go for same; sts rash cleared with medications prescribed but has now returned; pt thinks it is worse this time

## 2023-10-21 NOTE — ED Provider Notes (Signed)
 [place] [facility] [place] Provider Note   CSN: [id number] Arrival date & time: 10/21/23  2049     Patient presents with: Rash   Aaron Powell is a 28 y.o. male.   50 your male returns the emergency room with recurrent rash to bilateral axillary areas and groin.  Patient was seen 1 month ago with same.  He completed doxycycline , topical nystatin  cream, topical triamcinolone  cream and oral prednisone  dose of 50 mg x 4 days.  He states initially the rash improved however came back with a vengeance.  He denies any fevers.  He denies applying anything topically to these areas other than his prescribed medications.  No other complaints or concerns.       Prior to Admission medications   Medication Sig Start Date End Date Taking? Authorizing Provider  hydrOXYzine  (ATARAX ) 25 MG tablet Take 1 tablet (25 mg total) by mouth every 6 (six) hours as needed for itching. 10/21/23  Yes Beverley Leita LABOR, PA-C  predniSONE  (STERAPRED UNI-PAK 21 TAB) 10 MG (21) TBPK tablet Take by mouth daily. Take 6 tabs by mouth daily  for 2 days, then 5 tabs for 2 days, then 4 tabs for 2 days, then 3 tabs for 2 days, 2 tabs for 2 days, then 1 tab by mouth daily for 2 days 10/21/23  Yes Beverley Leita LABOR, PA-C  doxycycline  (VIBRAMYCIN ) 100 MG capsule Take 1 capsule (100 mg total) by mouth 2 (two) times daily. 09/24/23   Rancour, Garnette, MD  famotidine  (PEPCID ) 20 MG tablet Take 1 tablet (20 mg total) by mouth daily. 06/12/23   Kingsley, Victoria K, DO  nystatin  cream (MYCOSTATIN ) Apply to affected area 2 times daily 09/24/23   Rancour, Garnette, MD  pantoprazole  (PROTONIX ) 20 MG tablet Take 1 tablet (20 mg total) by mouth daily. 05/26/19   Donah Riis A, PA-C  triamcinolone  cream (KENALOG ) 0.1 % Apply 1 Application topically 2 (two) times daily. 09/24/23   Rancour, Garnette, MD  valACYclovir  (VALTREX ) 1000 MG tablet Take 1 tablet (1,000 mg total) by mouth 3 (three) times daily. 09/24/23    Carita Garnette, MD    Allergies: Patient has no known allergies.    Review of Systems Negative except as per HPI Updated Vital Signs BP (!) 114/58   Pulse (!) 55   Temp 98.3 F (36.8 C)   Resp 18   Ht 5' 8 (1.727 m)   Wt 73.5 kg   SpO2 99%   BMI 24.63 kg/m   Physical Exam Vitals and nursing note reviewed.  Constitutional:      General: He is not in acute distress.    Appearance: He is well-developed. He is not diaphoretic.  HENT:     Head: Normocephalic and atraumatic.  Pulmonary:     Effort: Pulmonary effort is normal.  Skin:    General: Skin is warm and dry.     Findings: Rash present.     Comments: Vesicular lesions noted to bilateral axillary areas and groin without concern for secondary infection, coalescing lesions.  Neurological:     Mental Status: He is alert and oriented to person, place, and time.  Psychiatric:        Behavior: Behavior normal.     (all labs ordered are listed, but only abnormal results are displayed) Labs Reviewed - No data to display  EKG: None  Radiology: No results found.   Procedures   Medications Ordered in the ED  dexamethasone  (DECADRON ) injection 10 mg (  has no administration in time range)                                    Medical Decision Making  28 year old male with return of rash.  On review of chart, suspect patient may have had rebound reaction after completing short course of prednisone .  Will prescribe a longer course of prednisone  as well as hydroxyzine  to help with itching.     Final diagnoses:  Contact dermatitis, unspecified contact dermatitis type, unspecified trigger    ED Discharge Orders          Ordered    predniSONE  (STERAPRED UNI-PAK 21 TAB) 10 MG (21) TBPK tablet  Daily        10/21/23 2218    hydrOXYzine  (ATARAX ) 25 MG tablet  Every 6 hours PRN        10/21/23 2218               Beverley Leita LABOR, PA-C 10/21/23 2219    Long, Fonda MATSU, MD 10/23/23 1049

## 2024-02-21 ENCOUNTER — Other Ambulatory Visit: Payer: Self-pay

## 2024-02-21 ENCOUNTER — Encounter (HOSPITAL_BASED_OUTPATIENT_CLINIC_OR_DEPARTMENT_OTHER): Payer: Self-pay

## 2024-02-21 ENCOUNTER — Emergency Department (HOSPITAL_BASED_OUTPATIENT_CLINIC_OR_DEPARTMENT_OTHER)
Admission: EM | Admit: 2024-02-21 | Discharge: 2024-02-21 | Disposition: A | Discharge status: Home / Self Care | Attending: Emergency Medicine | Admitting: Emergency Medicine

## 2024-02-21 DIAGNOSIS — R21 Rash and other nonspecific skin eruption: Secondary | ICD-10-CM | POA: Diagnosis present

## 2024-02-21 MED ORDER — CLOTRIMAZOLE-BETAMETHASONE 1-0.05 % EX CREA: TOPICAL_CREAM | CUTANEOUS | 2 refills | Status: AC

## 2024-02-21 NOTE — ED Notes (Signed)

## 2024-02-21 NOTE — Discharge Instructions (Signed)
 Apply cream as prescribed. Follow up with dermatology as soon as possible. Recheck with your doctor.

## 2024-02-21 NOTE — ED Provider Notes (Signed)
 [facility] Provider Note   CSN: [id number] Arrival date & time: 02/21/24  1754     Patient presents with: Rash   Aaron Powell is a 28 y.o. male.   28 year old male presents with recurrent rash to bilateral axillary areas. States intermittent for the past year +, currently awaiting dermatology appointment months out. Last occurrence was about 1.5 months ago, resolved after 2 weeks of treatment and returned a few days ago with a few bumps, now involves bilateral mid axillary chest and upper arms, spares the armpit areas.  Rash is described as itchy.  Denies changes to soaps, lotions, detergents or deodorant.  There is nothing new or different about this episode today.       Prior to Admission medications   Medication Sig Start Date End Date Taking? Authorizing Provider  clotrimazole -betamethasone  (LOTRISONE ) cream Apply to affected area 2 times daily prn 02/21/24  Yes Beverley Leita LABOR, PA-C  doxycycline  (VIBRAMYCIN ) 100 MG capsule Take 1 capsule (100 mg total) by mouth 2 (two) times daily. 09/24/23   Rancour, Garnette, MD  famotidine  (PEPCID ) 20 MG tablet Take 1 tablet (20 mg total) by mouth daily. 06/12/23   Kingsley, Victoria K, DO  hydrOXYzine  (ATARAX ) 25 MG tablet Take 1 tablet (25 mg total) by mouth every 6 (six) hours as needed for itching. 10/21/23   Beverley Leita LABOR, PA-C  nystatin  cream (MYCOSTATIN ) Apply to affected area 2 times daily 09/24/23   Rancour, Garnette, MD  pantoprazole  (PROTONIX ) 20 MG tablet Take 1 tablet (20 mg total) by mouth daily. 05/26/19   Donah Penne LABOR, PA-C  predniSONE  (STERAPRED UNI-PAK 21 TAB) 10 MG (21) TBPK tablet Take by mouth daily. Take 6 tabs by mouth daily  for 2 days, then 5 tabs for 2 days, then 4 tabs for 2 days, then 3 tabs for 2 days, 2 tabs for 2 days, then 1 tab by mouth daily for 2 days 10/21/23   Beverley Leita LABOR, PA-C  triamcinolone  cream (KENALOG ) 0.1 % Apply 1 Application topically 2 (two)  times daily. 09/24/23   Rancour, Garnette, MD  valACYclovir  (VALTREX ) 1000 MG tablet Take 1 tablet (1,000 mg total) by mouth 3 (three) times daily. 09/24/23   Carita Garnette, MD    Allergies: Patient has no known allergies.    Review of Systems Negative except as per HPI Updated Vital Signs BP (!) 140/55 (BP Location: Right Arm)   Pulse (!) 57   Temp 98.7 F (37.1 C) (Oral)   Resp (!) 22   Ht 5' 8 (1.727 m)   Wt 77.1 kg   SpO2 100%   BMI 25.85 kg/m   Physical Exam Vitals and nursing note reviewed.  Constitutional:      General: He is not in acute distress.    Appearance: He is well-developed. He is not diaphoretic.  HENT:     Head: Normocephalic and atraumatic.  Pulmonary:     Effort: Pulmonary effort is normal.  Skin:    General: Skin is warm and dry.     Findings: Rash present.     Comments: Rash to bilateral axillary trunk and upper arm area sparing the armpits, call lasting, raised, hyperpigmented without vesicles or evidence of secondary infection  Neurological:     Mental Status: He is alert and oriented to person, place, and time.  Psychiatric:        Behavior: Behavior normal.     (all labs ordered are listed, but  only abnormal results are displayed) Labs Reviewed - No data to display  EKG: None  Radiology: No results found.   Procedures   Medications Ordered in the ED - No data to display                                  Medical Decision Making Risk Prescription drug management.   28 year old male with recurrent rash.  Reports improved in the past with clotrimazole /betamethasone .  Will prescribe same.  Advised to follow-up with dermatology as soon as possible, can recheck with PCP.     Final diagnoses:  Rash and nonspecific skin eruption    ED Discharge Orders          Ordered    clotrimazole -betamethasone  (LOTRISONE ) cream        02/21/24 1831               Beverley Leita LABOR, PA-C 02/21/24 1836    Lenor Hollering,  MD 02/21/24 2257

## 2024-02-21 NOTE — ED Triage Notes (Signed)
 Rash under both armpits that has been ongoing for a year and a half. Rash worsened today.
